# Patient Record
Sex: Female | Born: 1955 | Race: Black or African American | Hispanic: No | Marital: Married | Smoking: Never smoker
Health system: Southern US, Community
[De-identification: ages and names within clinical notes are randomized; demographics above are authoritative.]

## PROBLEM LIST (undated history)

## (undated) DIAGNOSIS — G8929 Other chronic pain: Secondary | ICD-10-CM

## (undated) DIAGNOSIS — F419 Anxiety disorder, unspecified: Secondary | ICD-10-CM

## (undated) DIAGNOSIS — M1990 Unspecified osteoarthritis, unspecified site: Secondary | ICD-10-CM

## (undated) DIAGNOSIS — N189 Chronic kidney disease, unspecified: Secondary | ICD-10-CM

## (undated) DIAGNOSIS — I10 Essential (primary) hypertension: Secondary | ICD-10-CM

## (undated) DIAGNOSIS — K219 Gastro-esophageal reflux disease without esophagitis: Secondary | ICD-10-CM

## (undated) DIAGNOSIS — E119 Type 2 diabetes mellitus without complications: Secondary | ICD-10-CM

## (undated) DIAGNOSIS — E785 Hyperlipidemia, unspecified: Secondary | ICD-10-CM

## (undated) HISTORY — PX: SPINE SURGERY: SHX786

## (undated) HISTORY — DX: Other chronic pain: G89.29

## (undated) HISTORY — DX: Essential (primary) hypertension: I10

## (undated) HISTORY — PX: TUBAL LIGATION: SHX77

## (undated) HISTORY — DX: Type 2 diabetes mellitus without complications: E11.9

## (undated) HISTORY — DX: Hyperlipidemia, unspecified: E78.5

## (undated) HISTORY — DX: Gastro-esophageal reflux disease without esophagitis: K21.9

---

## 2015-04-26 ENCOUNTER — Encounter: Payer: Self-pay | Admitting: *Deleted

## 2015-04-26 ENCOUNTER — Encounter: Payer: Medicaid Other | Attending: Endocrinology | Admitting: *Deleted

## 2015-04-26 VITALS — Ht 62.0 in | Wt 214.6 lb

## 2015-04-26 DIAGNOSIS — Z713 Dietary counseling and surveillance: Secondary | ICD-10-CM | POA: Insufficient documentation

## 2015-04-26 DIAGNOSIS — E119 Type 2 diabetes mellitus without complications: Secondary | ICD-10-CM

## 2015-04-26 NOTE — Progress Notes (Signed)
Diabetes Self-Management Education  Visit Type: First/Initial (DX 2011)  Appt. Start Time: 1400 Appt. End Time: 1530  04/26/2015  [name], identified by name and date of birth, is a 59 y.o. female with a diagnosis of Diabetes: Type 2.  Other people present during visit:  Patient, Spouse/SO . Mr & Regina Stone arrived via "Medicaid Transportation". They do not have a care or drive.They utilize the bus to go to the grocery. He does most of the cooking. Regina Stone receives one meal through [employer] 5 days per week. Regina Stone does not seem to fully comprehend basic conversation and is unable to answer questions related to types of food she eats. Her husband is very engaged and appreciative of the pictures provided. A card identifying the foods in the basic food groups. He notes that some type of professional used to come to their house to help them with their medications and food choices but they no longer come. He did not know where they came from. In review of the general eating, they are not making bad choices and their are making good decisions about portion control.   Mr. Regina Stone asked if they could come back next week to see how good she is doing. We will have them return in one months.  ASSESSMENT  Height 5\' 2"  (1.575 m), weight 214 lb 9.6 oz (97.342 kg). Body mass index is 39.24 kg/(m^2).  Initial Visit Information:  Are you currently following a meal plan?: No Are you taking your medications as prescribed?: Yes Are you checking your feet?: No How often do you need to have someone help you when you read instructions, pamphlets, or other written materials from your doctor or pharmacy?: 4 - Often   Psychosocial:   Patient Belief/Attitude about Diabetes: Motivated to manage diabetes Self-care barriers: Low literacy, Lack of transportation, Unsteady gait/risk for falls, Lack of material resources Self-management support: Doctor's office, Family, CDE visits Other persons  present: Patient, Spouse/SO Patient Concerns: Nutrition/Meal planning Special Needs: Instruct caregiver Preferred Learning Style: Visual Learning Readiness: Ready  Complications:   Last HgB A1C per patient/outside source: 5.7 mg/dL How often do you check your blood sugar?: 3-4 times/day Fasting Blood glucose range (mg/dL): 16-10970-129, [id number] (98-11996-191) Postprandial Blood glucose range (mg/dL):  (befor lunch 14-78276-117   before dinner  70-144) Have you had a dilated eye exam in the past 12 months?: No (04/28/15) Have you had a dental exam in the past 12 months?: No (05/17/15)  Diet Intake:  Breakfast: Oatmeal brown sugar cinnamon / cereal, raisin bran /  something with a heart on it. Snack (morning): grapes,  Lunch: chicken sandwich, vegetable (peas, carrotts) , white potatoes , fruit cocktail, Snack (afternoon): fruit Dinner: tuna sandwich / peach /   Snack (evening): fruit  Exercise:  Exercise: ADL's  Individualized Plan for Diabetes Self-Management Training:   Learning Objective:  Patient will have a greater understanding of diabetes self-management. Patient education plan per assessed needs and concerns is to attend individual sessions     Education Topics Reviewed with Patient Today:   Meal options for control of blood glucose level and chronic complications., Role of diet in the treatment of diabetes and the relationship between the three main macronutrients and blood glucose level Role of exercise on diabetes management, blood pressure control and cardiac health. Reviewed patients medication for diabetes, action, purpose, timing of dose and side effects. Purpose and frequency of SMBG. Dental care  PATIENTS GOALS/Plan (Developed by the patient):  Nutrition: General guidelines for  healthy choices and portions discussed Physical Activity: Exercise 1-2 times per week Medications: take my medication as prescribed   Patient Instructions  Do not drink orange juice or apple juice  or gatorade Only eat 1/2 banana at a time Eat three balanced meals per day to include protein, starch and vegetables. Two My Plate Pictures provided as examples Change from whole milk to 2% milk  Expected Outcomes:  Demonstrated interest in learning. Expect positive outcomes  Education material provided: Meal plan card and My Plate  If problems or questions, patient to contact team via:  Phone  Future DSME appointment: 4-6 wks

## 2015-04-26 NOTE — Patient Instructions (Signed)
Do not drink orange juice or apple juice or gatorade Only eat 1/2 banana at a time Eat three balanced meals per day to include protein, starch and vegetables. Two My Plate Pictures provided as examples Change from whole milk to 2% milk

## 2015-05-24 ENCOUNTER — Ambulatory Visit: Payer: Medicaid Other | Admitting: *Deleted

## 2015-05-30 ENCOUNTER — Ambulatory Visit: Payer: Self-pay | Admitting: Podiatry

## 2015-05-31 ENCOUNTER — Encounter: Payer: Self-pay | Admitting: *Deleted

## 2015-05-31 ENCOUNTER — Encounter: Payer: Medicaid Other | Attending: Endocrinology | Admitting: *Deleted

## 2015-05-31 DIAGNOSIS — Z713 Dietary counseling and surveillance: Secondary | ICD-10-CM | POA: Insufficient documentation

## 2015-05-31 DIAGNOSIS — E119 Type 2 diabetes mellitus without complications: Secondary | ICD-10-CM | POA: Insufficient documentation

## 2015-05-31 NOTE — Progress Notes (Signed)
Diabetes Self-Management Education  Visit Type:  Follow-up  Appt. Start Time: 1500 Appt. End Time: 1600  05/31/2015  [name], identified by name and date of birth, is a 59 y.o. female with a diagnosis of Diabetes: Type 2.  Patient present alone for follow up. She is usually accompanied by her husband. She states her husband is not feeling well. This is unfortunate as patient is unable to read and has poor recall ability. She is unable to tell me what she has been eating. Upon arrival she tested her sugar (2:11pm) with a result of /dl. She was given 4 oz of apple juice. Fifteen minutes later her glucose was /dl. 16X of glucose tabs were given at 2:45pm. Patient left @ 4:27 with a glucose of /dl. She does not have hypoglycemia awareness.      ASSESSMENT  Height  (1.676 m), weight 206 lb 6.4 oz (93.622 kg). Body mass index is 33.33 kg/(m^2).    Subsequent Visit Information:  Since your last visit, have you continued or began the use of a meal plan?: Yes Since your last visit, have you continued or began to exercise on a consistent basis?: Yes How many days per week are you exercising or participating in a physicial activity for more than 20 minutes?: 7 (husband walks with her) Since your last visit have you continued or begun to take your medications as prescribed?: Yes Since your last visit have you experienced any weight changes?: Loss Weight Loss (lbs): 8.6 Since your last visit, are you checking your blood glucose at least once a day?: Yes  Psychosocial:   Patient Belief/Attitude about Diabetes: Motivated to manage diabetes  Complications:   How often do you check your blood sugar?: 1-2 times/day Fasting Blood glucose range (mg/dL): 09-604, 540-981 (19-147) Postprandial Blood glucose range (mg/dL): >829, <56 (21-30) Number of hypoglycemic episodes per month: 5 Can you tell when your blood sugar is low?: No Number of hyperglycemic episodes per week:  1 Can you tell when your blood sugar is high?: No  Diet Intake:  Lunch: baked cheeseburger, / salad / water Dinner: baked fish, baked chicken, vegetables, Snack (evening): no fat yogurt Beverage(s): water, diet orange soda  Exercise:  Light Exercise amount of time (min / week): 150  Individualized Plan for Diabetes Self-Management Training:   Learning Objective:  Patient will have a greater understanding of diabetes self-management. Patient education plan per assessed needs and concerns is to attend individual sessions     Education Topics Reviewed with Patient Today:   Role of exercise on diabetes management, blood pressure control and cardiac health. Purpose and frequency of SMBG.  PATIENTS GOALS/Plan (Developed by the patient):  Nutrition: General guidelines for healthy choices and portions discussed Physical Activity: Exercise 5-7 days per week Reducing Risk: treat hypoglycemia with 15 grams of carbs if blood glucose less than /dL  Patient Self Evaluation of Goals - Patient rates self as meeting previously set goals:   Nutrition: >75% Physical Activity: >75% Medications: >75% Reducing Risk: >75%  Patient Instructions  Keep doing such a fabulous job.  You and your husband are doing a good job with food choices and making sure you don't eat too much. Your blood sugars are doing good also.  I noticed there were some glucose readings of /dl. When your sugar is below 70 I want you to have some fruit juice to drink or have some bread. Eat some food. Keep walking together daily depending on the weather. [name] has  lost 8.6# since I saw her last. CONGRATULATIONS!   Expected Outcomes:  Demonstrated interest in learning. Expect positive outcomes  If problems or questions, patient to contact team via:  Phone  Future DSME appointment: - PRN

## 2015-05-31 NOTE — Patient Instructions (Signed)
Keep doing such a fabulous job.  You and your husband are doing a good job with food choices and making sure you don't eat too much. Your blood sugars are doing good also.  I noticed there were some glucose readings of 52mg /dl. When your sugar is below 70 I want you to have some fruit juice to drink or have some bread. Eat some food. Keep walking together daily depending on the weather. Regina MossesDiana has lost 8.6# since I saw her last. CONGRATULATIONS!

## 2015-06-14 ENCOUNTER — Encounter: Payer: Self-pay | Admitting: Podiatry

## 2015-06-14 ENCOUNTER — Ambulatory Visit (INDEPENDENT_AMBULATORY_CARE_PROVIDER_SITE_OTHER): Payer: Medicaid Other | Admitting: Podiatry

## 2015-06-14 VITALS — BP 113/74 | HR 78 | Ht 62.0 in | Wt 214.0 lb

## 2015-06-14 DIAGNOSIS — M79606 Pain in leg, unspecified: Secondary | ICD-10-CM

## 2015-06-14 DIAGNOSIS — B351 Tinea unguium: Secondary | ICD-10-CM | POA: Diagnosis not present

## 2015-06-14 NOTE — Patient Instructions (Signed)
Seen for hypertrophic nails. All nails debrided. Return as needed.  

## 2015-06-14 NOTE — Progress Notes (Signed)
Subjective: 59 year old female presents complaining of pain on both feet. Nails are grown in and been hurting for several days.  Patient does not understand why she is on Lyrica.   Review of Systems - Negative except chief complaints.   Objective:  Dermatologic: Dystrophic and hypertrophic nails x 10. Ingrown nails 2nd bilateral. Distal ends are over grown and digging into flesh.  Vascular: All pedal pulses are palpable. No edema or erythema noted. Neurologic: All epicritic and tactile sensations are grossly intact. Orthopedic: No gross deformities noted.  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  Plan: All nails debrided. Pain relieved.

## 2015-06-29 ENCOUNTER — Ambulatory Visit: Payer: Medicaid Other | Admitting: *Deleted

## 2015-07-13 ENCOUNTER — Encounter: Payer: Self-pay | Admitting: *Deleted

## 2015-07-13 ENCOUNTER — Encounter: Payer: Medicaid Other | Attending: Endocrinology | Admitting: *Deleted

## 2015-07-13 VITALS — Wt 211.1 lb

## 2015-07-13 DIAGNOSIS — Z713 Dietary counseling and surveillance: Secondary | ICD-10-CM | POA: Diagnosis not present

## 2015-07-13 DIAGNOSIS — E119 Type 2 diabetes mellitus without complications: Secondary | ICD-10-CM | POA: Diagnosis not present

## 2015-07-13 NOTE — Patient Instructions (Signed)
Continue walking as many days as possible Try to add some vegetables to your meals Decrease you glucose testing to 2 times per day to include Fasting (before breakfast) and 2 hours after dinner Add some peanut butter to your 1/2 banana night time snack to see if we can get your fasting blood sugar to come down.  You are doing a great job! Keep it up.

## 2015-08-17 ENCOUNTER — Encounter: Payer: Self-pay | Admitting: *Deleted

## 2015-08-17 ENCOUNTER — Encounter: Payer: Medicaid Other | Attending: Endocrinology | Admitting: *Deleted

## 2015-08-17 VITALS — Wt 207.9 lb

## 2015-08-17 DIAGNOSIS — Z713 Dietary counseling and surveillance: Secondary | ICD-10-CM | POA: Insufficient documentation

## 2015-08-17 DIAGNOSIS — E119 Type 2 diabetes mellitus without complications: Secondary | ICD-10-CM | POA: Insufficient documentation

## 2015-08-17 NOTE — Patient Instructions (Addendum)
At night time when you have your snack - piece of fruit I would like you to add some protein....Marland Kitchennuts, cheese, peanut butter, boiled egg  Test two times each day. First thing in the morning before breakfast and 2 hours after dinner.  You are doing a great job. Keep up the good work.  It has been a pleasure working with you. Come see me if you have any further problems.

## 2015-08-17 NOTE — Progress Notes (Signed)
Diabetes Self-Management Education  Visit Type:  Follow-up  Appt. Start Time: 1400 Appt. End Time: 1430  08/17/2015  [name], identified by name and date of birth, is a 59 y.o. female with a diagnosis of Diabetes: Type 2.  Gerianne has recently changed PCP. However she does not recall their name. She was also put on a new medication that she thinks is the help her blood sugar. Her prescriptions are sent to mail in pharmacy and does not know the name of the medication. Regina Stone's dietary intake has greatly improved as well as her glucose numbers since I first saw her.  ASSESSMENT  Weight 207 lb 14.4 oz (94.303 kg). Body mass index is 38.02 kg/(m^2).       Diabetes Self-Management Education - 08/17/15 1408    Health Coping   How would you rate your overall health? Good   Psychosocial Assessment   Patient Belief/Attitude about Diabetes Motivated to manage diabetes   Self-care barriers Low literacy   Self-management support Doctor's office;Family;CDE visits   Patient Concerns Nutrition/Meal planning;Glycemic Control   Special Needs Simplified materials   Preferred Learning Style No preference indicated   Learning Readiness Change in progress   Complications   How often do you check your blood sugar? 3-4 times/day   Fasting Blood glucose range (mg/dL) 16-109;604-540  98-119   Postprandial Blood glucose range (mg/dL) 14-782;>956  21-308   Number of hypoglycemic episodes per month 0   Number of hyperglycemic episodes per week 0.5   Dietary Intake   Breakfast oatmeal, toast, diet jelly, water   Snack (morning) none   Lunch baked hamburger, on wheat bread, cheese, diet coke,    Snack (afternoon) none   Regina Stone on bun X1, potato, carrots, water   Snack (evening) fruit   Beverage(s) water, diet coke, Diet V8 juice   Exercise   Exercise Type ADL's   Patient Education   Previous Diabetes Education Yes (please comment)   Nutrition management  Meal options for control  of blood glucose level and chronic complications.   Monitoring Taught/discussed recording of test results and interpretation of SMBG.   Individualized Goals (developed by patient)   Nutrition General guidelines for healthy choices and portions discussed   Medications take my medication as prescribed   Monitoring  test blood glucose pre and post meals as discussed   Patient Self-Evaluation of Goals - Patient rates self as meeting previously set goals (% of time)   Nutrition >75%   Physical Activity 25 - 50%   Medications >75%   Monitoring >75%   Reducing Risk >75%   Health Coping >75%   Outcomes   Program Status Completed   Subsequent Visit   Since your last visit, have you continued or began the use of a meal plan? Yes   How many days a week are you following a meal plan? 7   Since your last visit, have you continued or began to exercise on a consistent basis? Yes   Since your last visit have you continued or begun to take your medications as prescribed? Yes   Since your last visit have you had your blood pressure checked? Yes   Since your last visit have you experienced any weight changes? Loss   Since your last visit, are you checking your blood glucose at least once a day? Yes      Learning Objective:  Patient will have a greater understanding of diabetes self-management. Patient education plan is to attend individual  and/or group sessions per assessed needs and concerns.   Plan:   Patient Instructions  At night time when you have your snack - piece of fruit I would like you to add some protein....Regina Stone, cheese, peanut butter, boiled egg  Test two times each day. First thing in the morning before breakfast and 2 hours after dinner.  You are doing a great job. Keep up the good work.  It has been a pleasure working with you. Come see me if you have any further problems.     Expected Outcomes:  Demonstrated interest in learning. Expect positive outcomes  If problems or  questions, patient to contact team via:  Phone  Future DSME appointment: - PRN

## 2016-01-19 ENCOUNTER — Encounter: Payer: Medicaid Other | Attending: Internal Medicine | Admitting: *Deleted

## 2016-01-19 ENCOUNTER — Encounter: Payer: Self-pay | Admitting: *Deleted

## 2016-01-19 DIAGNOSIS — E1142 Type 2 diabetes mellitus with diabetic polyneuropathy: Secondary | ICD-10-CM | POA: Insufficient documentation

## 2016-01-19 DIAGNOSIS — E119 Type 2 diabetes mellitus without complications: Secondary | ICD-10-CM

## 2016-01-19 NOTE — Progress Notes (Signed)
Diabetes Self-Management Education  Visit Type:  Follow-up  Appt. Start Time: 0800 Appt. End Time: 0830  01/19/2016  Regina Stone, identified by name and date of birth, is a 60 y.o. female with a diagnosis of Diabetes:  Patient present for follow up of T2DM. In review of her glucose readings; FBS range 115-212mg /dl I scheduled an appointment with Regina Peri, FNP-BC at [facility] for Monday 01/23/2016 @ 9:10AM. Patient also noted that she told her pharmacist that her HTN medication was making her sick so it was discontinued. No further evaluation of HTN has been performed, no replacement medication provided. I hae encouraged Regina Stone to continue f/u with Regina Stone regularly for management of chronic health conditions. Regina Stone is very compliant, verbalized understanding and is appreciative of assistance.  ASSESSMENT       Diabetes Self-Management Education - 01/19/16 0826    Psychosocial Assessment   Patient Belief/Attitude about Diabetes Motivated to manage diabetes   Self-care barriers Low literacy   Self-management support Doctor's office;Family   Patient Concerns Medication;Glycemic Control   Special Needs Simplified materials   Preferred Learning Style No preference indicated   Learning Readiness Change in progress   Complications   How often do you check your blood sugar? 1-2 times/day   Fasting Blood glucose range (mg/dL) 19-147;>829   Exercise   Exercise Type ADL's   Patient Education   Previous Diabetes Education Yes (please comment)   Medications Other (comment)  HTN medication discontinued to go "Make me sick"  no f/u with PCP for new medication   Individualized Goals (developed by patient)   Medications Other (comment)  Schedule appointmetn with PCP for evaluation of HTN and T2DM medications   Patient Self-Evaluation of Goals - Patient rates self as meeting previously set goals (% of time)   Nutrition >75%   Physical Activity 25 - 50%   Medications >75%   Monitoring >75%   Problem Solving < 25%   Reducing Risk >75%   Health Coping 50 - 75 %   Outcomes   Program Status Completed   Subsequent Visit   Since your last visit have you continued or begun to take your medications as prescribed? Yes  Pt c/o HTN medication making her sick. Pharmacy advised her to discontinue. Has not followed up with PCP    Since your last visit have you had your blood pressure checked? No   Since your last visit have you experienced any weight changes? No change   Since your last visit, are you checking your blood glucose at least once a day? Yes      Learning Objective:  Patient will have a greater understanding of diabetes self-management. Patient education plan is to attend individual and/or group sessions per assessed needs and concerns.   Patient Instructions  Continue eating well Continue testing your glucose FBS and 2hpp Follow up with Regina Regina Saa FNP-BC at [facility] on 01/23/16 @ 9:10AM  Expected Outcomes:  Demonstrated interest in learning. Expect positive outcomes  If problems or questions, patient to contact team via:  Phone  Future DSME appointment: - PRN

## 2016-01-19 NOTE — Patient Instructions (Signed)
Continue eating well Continue testing your glucose FBS and 2hpp Follow up with laura Dreama Saa FNP-BC at [facility] on 01/23/16 @ 9:10AM

## 2016-07-11 ENCOUNTER — Ambulatory Visit (INDEPENDENT_AMBULATORY_CARE_PROVIDER_SITE_OTHER): Payer: Medicaid Other | Admitting: Podiatry

## 2016-07-11 ENCOUNTER — Encounter: Payer: Self-pay | Admitting: Podiatry

## 2016-07-11 VITALS — BP 133/80 | HR 75

## 2016-07-11 DIAGNOSIS — B351 Tinea unguium: Secondary | ICD-10-CM

## 2016-07-11 DIAGNOSIS — M79606 Pain in leg, unspecified: Secondary | ICD-10-CM | POA: Diagnosis not present

## 2016-07-11 NOTE — Patient Instructions (Signed)
Seen for hypertrophic nails. All nails debrided. Return in 3 months or as needed.  

## 2016-07-11 NOTE — Progress Notes (Signed)
Subjective: 60 year old female presents complaining of pain on 2nd toe left foot.  Nails are grown in and been hurting for several days.  Patient does not understand why she is on Lyrica.   Review of Systems - Negative except chief complaints.   Objective:  Dermatologic: Dystrophic and hypertrophic nails x 10. Thick and long left great toe nail was pressing against the toe 2nd left and causing hard callused skin with pain. Vascular: All pedal pulses are palpable. No edema or erythema noted. Neurologic: All epicritic and tactile sensations are grossly intact. Orthopedic: No gross deformities noted.  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 Pain in 2nd toe left from adjacent thick long nail digging into skin.  Plan: All nails debrided. Pain relieved.

## 2016-10-10 ENCOUNTER — Encounter: Payer: Self-pay | Admitting: Podiatry

## 2016-10-10 ENCOUNTER — Ambulatory Visit (INDEPENDENT_AMBULATORY_CARE_PROVIDER_SITE_OTHER): Payer: Medicaid Other | Admitting: Podiatry

## 2016-10-10 VITALS — BP 135/85 | HR 74

## 2016-10-10 DIAGNOSIS — M79673 Pain in unspecified foot: Secondary | ICD-10-CM | POA: Diagnosis not present

## 2016-10-10 DIAGNOSIS — B351 Tinea unguium: Secondary | ICD-10-CM

## 2016-10-10 DIAGNOSIS — M79606 Pain in leg, unspecified: Secondary | ICD-10-CM

## 2016-10-10 NOTE — Progress Notes (Signed)
Subjective: 60 year old female presents complaining of painful feet. Nails are grown in and been hurting for several days.  Patient does not understand why s. Patient points 2nd toe left foot has and painful knot.    Objective: Dermatologic: Dystrophic and hypertrophic nails x 10. Enlarged distal medial phalangeal bone with pain from pressing against the great toe. Thick broad plantar calluses on both feet.  Vascular: All pedal pulses are palpable. No edema or erythema noted. Neurologic: All epicritic and tactile sensations are grossly intact. Orthopedic: No gross deformities noted.  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 Phalangeal spur with pain in 2nd toe left with pain in shoes.  Plan: All nails, corns, and calluses debrided.

## 2016-10-10 NOTE — Patient Instructions (Signed)
Seen for hypertrophic nails, corns and calluses on both feet. All nails, corns, and calluses debrided. Return in 3 months or as needed.

## 2016-11-14 ENCOUNTER — Telehealth (INDEPENDENT_AMBULATORY_CARE_PROVIDER_SITE_OTHER): Payer: Self-pay | Admitting: Orthopaedic Surgery

## 2016-11-14 NOTE — Telephone Encounter (Signed)
I called patient. She requests refill on Ibuprofen 800mg . Please advise.

## 2016-11-14 NOTE — Telephone Encounter (Signed)
OK refill thanks 

## 2016-11-14 NOTE — Telephone Encounter (Signed)
Pt requesting a refill. Could not understand what she was saying as far as the mediction but she said dr. Ophelia Charteryates prescibed it. [phone]

## 2016-11-15 MED ORDER — IBUPROFEN 800 MG PO TABS: 800.0000 mg | ORAL_TABLET | Freq: Two times a day (BID) | ORAL | 0 refills | Status: DC

## 2016-11-15 NOTE — Telephone Encounter (Signed)
I called patient and advised. Script entered and sent to pharmacy.

## 2016-12-10 ENCOUNTER — Other Ambulatory Visit (INDEPENDENT_AMBULATORY_CARE_PROVIDER_SITE_OTHER): Payer: Self-pay | Admitting: Orthopaedic Surgery

## 2016-12-11 NOTE — Telephone Encounter (Signed)
Ok for refill? 

## 2017-01-10 ENCOUNTER — Encounter: Payer: Self-pay | Admitting: Podiatry

## 2017-01-10 ENCOUNTER — Ambulatory Visit (INDEPENDENT_AMBULATORY_CARE_PROVIDER_SITE_OTHER): Payer: Medicaid Other | Admitting: Podiatry

## 2017-01-10 DIAGNOSIS — M216X2 Other acquired deformities of left foot: Secondary | ICD-10-CM | POA: Diagnosis not present

## 2017-01-10 DIAGNOSIS — M79672 Pain in left foot: Secondary | ICD-10-CM | POA: Diagnosis not present

## 2017-01-10 DIAGNOSIS — M79606 Pain in leg, unspecified: Secondary | ICD-10-CM

## 2017-01-10 DIAGNOSIS — M659 Synovitis and tenosynovitis, unspecified: Secondary | ICD-10-CM | POA: Diagnosis not present

## 2017-01-10 NOTE — Patient Instructions (Signed)
Seen for painful left ankle and hypertrophic nails. All nails debrided. May use Advil for ankle pain. Return in 3 months or as needed.

## 2017-01-10 NOTE — Progress Notes (Signed)
Subjective: [age]year old female presents complaining of painful feet. Left ankle has been hurting for long time. Hurts even at night. Patient goes to store on foot.  Objective: Dermatologic: Dystrophic and hypertrophic nails x 10. Vascular: All pedal pulses are palpable. No edema or erythema noted. Mild left lateral ankle edema. Neurologic: All epicritic and tactile sensations are grossly intact. Orthopedic: Positive of excess inversion of ankle joint. Excess ankle joint pronation bilateral L>R. No pain with range of motion.   Assessment: Onychogryphosis bilateral. Tenosynovitis left ankle. Hyperpronation ankle joint L>R.  Plan: All nails debrided.  Advised to take Advil as needed for ankle pain.  Return in 3 months or sooner if needed.

## 2017-02-01 ENCOUNTER — Other Ambulatory Visit (INDEPENDENT_AMBULATORY_CARE_PROVIDER_SITE_OTHER): Payer: Self-pay | Admitting: Orthopaedic Surgery

## 2017-02-04 NOTE — Telephone Encounter (Signed)
Ok for refill? 

## 2017-02-28 ENCOUNTER — Other Ambulatory Visit (INDEPENDENT_AMBULATORY_CARE_PROVIDER_SITE_OTHER): Payer: Self-pay | Admitting: Orthopaedic Surgery

## 2017-03-01 NOTE — Telephone Encounter (Signed)
Rx request 

## 2017-04-09 ENCOUNTER — Ambulatory Visit (INDEPENDENT_AMBULATORY_CARE_PROVIDER_SITE_OTHER): Payer: Medicaid Other | Admitting: Podiatry

## 2017-04-09 ENCOUNTER — Encounter: Payer: Self-pay | Admitting: Podiatry

## 2017-04-09 DIAGNOSIS — M216X2 Other acquired deformities of left foot: Secondary | ICD-10-CM | POA: Diagnosis not present

## 2017-04-09 DIAGNOSIS — B351 Tinea unguium: Secondary | ICD-10-CM | POA: Diagnosis not present

## 2017-04-09 DIAGNOSIS — Q828 Other specified congenital malformations of skin: Secondary | ICD-10-CM

## 2017-04-09 DIAGNOSIS — M79606 Pain in leg, unspecified: Secondary | ICD-10-CM

## 2017-04-09 NOTE — Progress Notes (Signed)
Subjective: [age]year old female presents complaining of pain in right foot.   Objective: Dermatologic: Dystrophic and hypertrophic nails x 10. Thick plantar callus under the base of the 5th metatarsal right, symptomatic.  Vascular: All pedal pulses are palpable. No edema or erythema noted. Neurologic: All epicritic and tactile sensations are grossly intact. Orthopedic: Excess ankle joint pronation bilateral L>R.  Assessment: Onychomycosis bilateral. Symptomatic plantar keratosis right. Hyperpronation ankle joint with lateral weight shifting right.  Plan: All nails and painful calluses debrided.  Return in 3 months or sooner if needed.

## 2017-04-09 NOTE — Patient Instructions (Signed)
Seen for painful callus on right foot and hypertrophic nails. All nails and calluses debrided. Return in 3 months or as needed.

## 2017-05-17 ENCOUNTER — Other Ambulatory Visit (INDEPENDENT_AMBULATORY_CARE_PROVIDER_SITE_OTHER): Payer: Self-pay | Admitting: Orthopaedic Surgery

## 2017-05-20 NOTE — Telephone Encounter (Signed)
Rx request 

## 2017-05-27 ENCOUNTER — Ambulatory Visit: Payer: Medicaid Other | Admitting: Registered"

## 2017-06-10 ENCOUNTER — Encounter: Payer: Self-pay | Admitting: Registered"

## 2017-06-10 ENCOUNTER — Encounter: Payer: Medicaid Other | Attending: Internal Medicine | Admitting: Registered"

## 2017-06-10 DIAGNOSIS — Z713 Dietary counseling and surveillance: Secondary | ICD-10-CM | POA: Insufficient documentation

## 2017-06-10 DIAGNOSIS — E119 Type 2 diabetes mellitus without complications: Secondary | ICD-10-CM

## 2017-06-10 NOTE — Patient Instructions (Signed)
Keep up good work getting in some walking to the store. Think about having more vegetables like tring beans, celery, carrots, leafy greens. Keep taking your medications as prescribed.

## 2017-06-10 NOTE — Progress Notes (Signed)
Diabetes Self-Management Education  Visit Type: First/Initial  Appt. Start Time: 1405 Appt. End Time: 1435  06/10/2017  [name], identified by name and date of birth, is a 61 y.o. female with a diagnosis of Diabetes: Type 2.   ASSESSMENT Pt states she always uses can to aid walking. Pt states she and her [place] [employer] (RD mapped distance: 1/2 mile). Pt states she doesn't do any other walking besides that.  Based on diet recall pt may not be getting much vegetables in diet, but appears to enjoy a variety of them. RD encouraged to eat more vegetables.     Diabetes Self-Management Education - 06/10/17 1419      Visit Information   Visit Type First/Initial     Initial Visit   Diabetes Type Type 2   Are you taking your medications as prescribed? Yes     Psychosocial Assessment   Patient Belief/Attitude about Diabetes Motivated to manage diabetes   Self-care barriers Low literacy   How often do you need to have someone help you when you read instructions, pamphlets, or other written materials from your doctor or pharmacy? 5 - Always     Complications   Last HgB A1C per patient/outside source 6.3 %  per chart 12/04/16   How often do you check your blood sugar? 1-2 times/day   Fasting Blood glucose range (mg/dL) 84-696;295-28470-129;130-179  pt shared BG log, rarely goes above 130   Postprandial Blood glucose range (mg/dL) 132-440130-179   Number of hypoglycemic episodes per month 1   Can you tell when your blood sugar is low? --  pt didn't understand question     Dietary Intake   Breakfast eggs, milk, bacon (no salt)   Snack (morning) apple   Lunch wwheat bread, tomato & mayo   Snack (afternoon) none   Dinner hamburger, lettuce, mayo   Snack (evening) none   Beverage(s) water, diet soda     Exercise   Exercise Type Light (walking / raking leaves)  walks to [employer], 1 mi round trip   How many days per week to you exercise? --  pt not able to quantify      Patient Education   Previous Diabetes Education Yes (please comment)   Nutrition management  Role of diet in the treatment of diabetes and the relationship between the three main macronutrients and blood glucose level   Physical activity and exercise  Role of exercise on diabetes management, blood pressure control and cardiac health.     Individualized Goals (developed by patient)   Nutrition General guidelines for healthy choices and portions discussed   Physical Activity Exercise 5-7 days per week     Outcomes   Expected Outcomes Demonstrated interest in learning. Expect positive outcomes   Future DMSE PRN   Program Status Completed    Individualized Plan for Diabetes Self-Management Training:   Learning Objective:  Patient will have a greater understanding of diabetes self-management. Patient education plan is to attend individual and/or group sessions per assessed needs and concerns.   Patient Instructions  Keep up good work getting in some walking to the store. Think about having more vegetables like string beans, celery, carrots, leafy greens. Keep taking your medications as prescribed.   Expected Outcomes:  Demonstrated interest in learning. Expect positive outcomes  Education material provided: My Plate  If problems or questions, patient to contact team via:  Phone  Future DSME appointment: PRN

## 2017-06-14 ENCOUNTER — Other Ambulatory Visit (INDEPENDENT_AMBULATORY_CARE_PROVIDER_SITE_OTHER): Payer: Self-pay | Admitting: Orthopaedic Surgery

## 2017-06-17 NOTE — Telephone Encounter (Signed)
Ok for refill? 

## 2017-07-10 ENCOUNTER — Ambulatory Visit: Payer: Medicaid Other | Admitting: Podiatry

## 2017-07-16 ENCOUNTER — Ambulatory Visit: Payer: Medicaid Other | Admitting: Podiatry

## 2017-07-23 ENCOUNTER — Ambulatory Visit (INDEPENDENT_AMBULATORY_CARE_PROVIDER_SITE_OTHER): Payer: Medicaid Other | Admitting: Podiatry

## 2017-07-23 ENCOUNTER — Encounter: Payer: Self-pay | Admitting: Podiatry

## 2017-07-23 DIAGNOSIS — B351 Tinea unguium: Secondary | ICD-10-CM | POA: Diagnosis not present

## 2017-07-23 DIAGNOSIS — M79606 Pain in leg, unspecified: Secondary | ICD-10-CM | POA: Diagnosis not present

## 2017-07-23 DIAGNOSIS — Q828 Other specified congenital malformations of skin: Secondary | ICD-10-CM

## 2017-07-23 NOTE — Progress Notes (Signed)
Subjective: [age]year old female presents complaining of painful nails on both feet. This is the same pain she had on her last visit.  Objective: Dermatologic: Dystrophic and hypertrophic nails x 10. Thick plantar callus under the base of the 5th metatarsal right, symptomatic.  Vascular: All pedal pulses are palpable. No edema or erythema noted. Neurologic: All epicritic and tactile sensations are grossly intact. Orthopedic: Excess ankle joint pronation bilateral L>R.  Assessment: Onychomycosis bilateral. Symptomatic plantar keratosis right. Hyperpronation ankle joint with lateral weight shifting right.  Plan: All nails and painful calluses debrided.  Return in 3 months or sooner if needed.

## 2017-07-23 NOTE — Patient Instructions (Signed)
Seen for hypertrophic nails. All nails debrided. Return in 3 months or as needed.  

## 2017-10-22 ENCOUNTER — Ambulatory Visit (INDEPENDENT_AMBULATORY_CARE_PROVIDER_SITE_OTHER): Payer: Medicaid Other | Admitting: Podiatry

## 2017-10-22 ENCOUNTER — Encounter: Payer: Self-pay | Admitting: Podiatry

## 2017-10-22 DIAGNOSIS — B351 Tinea unguium: Secondary | ICD-10-CM

## 2017-10-22 DIAGNOSIS — M79672 Pain in left foot: Secondary | ICD-10-CM | POA: Diagnosis not present

## 2017-10-22 DIAGNOSIS — L600 Ingrowing nail: Secondary | ICD-10-CM | POA: Diagnosis not present

## 2017-10-22 NOTE — Progress Notes (Signed)
Subjective: 61 y.o. year old female patient presents complaining of painful toe 2nd left. Patient points distal medial enlarged bump.  Objective: Dermatologic: Thick yellow deformed nails x 10. Valgus rotated left great toe with thick ingrown dystrophic nail is pressing against the 2nd toe left resulting pain.  Plantar callus under the 5th MPJ right painful. Vascular: Pedal pulses are all palpable. No edema or erythema noted. Orthopedic: Severe hallux valgus with bunion L>R, severe ankle joint pronation L>R. Enlarged phalangeal head distal medial 2nd toe left with pain in closed in shoes. Neurologic: All epicritic and tactile sensations grossly intact.  Assessment: Dystrophic mycotic nails x 10. Ingrown left great toe without infection. Pain in 2nd toe left due to pressure from valgus rotated thick dystrophic hallucal nail.  Treatment: All mycotic nails, corns, calluses debrided. Pain relieved. Return in 3 months or as needed.

## 2017-10-22 NOTE — Patient Instructions (Signed)
Seen for hypertrophic nails. All nails debrided. Return in 3 months or as needed.  

## 2018-01-20 ENCOUNTER — Ambulatory Visit: Payer: Self-pay | Admitting: Podiatry

## 2018-02-14 ENCOUNTER — Ambulatory Visit: Payer: Medicaid Other | Admitting: Podiatry

## 2018-02-14 ENCOUNTER — Encounter: Payer: Self-pay | Admitting: Podiatry

## 2018-02-14 DIAGNOSIS — M79675 Pain in left toe(s): Secondary | ICD-10-CM

## 2018-02-14 DIAGNOSIS — M79674 Pain in right toe(s): Secondary | ICD-10-CM

## 2018-02-14 DIAGNOSIS — B351 Tinea unguium: Secondary | ICD-10-CM | POA: Diagnosis not present

## 2018-02-14 DIAGNOSIS — M79676 Pain in unspecified toe(s): Secondary | ICD-10-CM | POA: Diagnosis not present

## 2018-02-14 DIAGNOSIS — L989 Disorder of the skin and subcutaneous tissue, unspecified: Secondary | ICD-10-CM

## 2018-02-16 NOTE — Progress Notes (Signed)
Subjective:   Patient ID: Regina Stone, female   DOB: 62 y.o.   MRN: [id number]   HPI 62 year old female presents the office today with concerns of thick, painful, elongated toenails that she cannot trim herself as well as for painful lesion on her left second toe.  She denies any recent injury or trauma.  She previously was seen Dr. Raynald Kemp for the same issues and she has no other concerns today.  Denies any open sores and denies any swelling or redness.   Review of Systems  All other systems reviewed and are negative.  Past Medical History:  Diagnosis Date  . Chronic pain    previous back surgery, foot pain  . Diabetes mellitus without complication (HCC)   . GERD (gastroesophageal reflux disease)   . Hyperlipidemia   . Hypertension     Past Surgical History:  Procedure Laterality Date  . SPINE SURGERY    . TUBAL LIGATION       Current Outpatient Medications:  .  aspirin 81 MG tablet, Take 81 mg by mouth daily., Disp: , Rfl:  .  ibuprofen (ADVIL,MOTRIN) 800 MG tablet, TAKE 1 TABLET BY MOUTH TWICE DAILY AS NEEDED., Disp: 60 tablet, Rfl: 2 .  lisinopril-hydrochlorothiazide (PRINZIDE,ZESTORETIC) 20-25 MG per tablet, Take 1 tablet by mouth daily. Reported on 01/19/2016, Disp: , Rfl:  .  metFORMIN (GLUCOPHAGE) 1000 MG tablet, Take 1,000 mg by mouth 2 (two) times daily with a meal., Disp: , Rfl:  .  omeprazole (PRILOSEC) 40 MG capsule, Take 40 mg by mouth daily., Disp: , Rfl:  .  ondansetron (ZOFRAN) 4 MG tablet, Take 4 mg by mouth every 8 (eight) hours as needed for nausea or vomiting., Disp: , Rfl:  .  pregabalin (LYRICA) 50 MG capsule, Take 50 mg by mouth 2 (two) times daily., Disp: , Rfl:  .  simvastatin (ZOCOR) 80 MG tablet, Take 80 mg by mouth at bedtime., Disp: , Rfl:  .  sitaGLIPtin (JANUVIA) 100 MG tablet, Take 100 mg by mouth daily., Disp: , Rfl:  .  traMADol (ULTRAM) 50 MG tablet, Take 50 mg by mouth every 6 (six) hours as needed., Disp: , Rfl:   Allergies  Allergen  Reactions  . Avandamet [Rosiglitazone-Metformin]   . Avapro [Irbesartan]     Social History   Socioeconomic History  . Marital status: Married    Spouse name: Not on file  . Number of children: Not on file  . Years of education: Not on file  . Highest education level: Not on file  Occupational History  . Not on file  Social Needs  . Financial resource strain: Not on file  . Food insecurity:    Worry: Not on file    Inability: Not on file  . Transportation needs:    Medical: Not on file    Non-medical: Not on file  Tobacco Use  . Smoking status: Never Smoker  . Smokeless tobacco: Never Used  Substance and Sexual Activity  . Alcohol use: Not on file  . Drug use: Not on file  . Sexual activity: Not on file  Lifestyle  . Physical activity:    Days per week: Not on file    Minutes per session: Not on file  . Stress: Not on file  Relationships  . Social connections:    Talks on phone: Not on file    Gets together: Not on file    Attends religious service: Not on file    Active member of club or  organization: Not on file    Attends meetings of clubs or organizations: Not on file    Relationship status: Not on file  . Intimate partner violence:    Fear of current or ex partner: Not on file    Emotionally abused: Not on file    Physically abused: Not on file    Forced sexual activity: Not on file  Other Topics Concern  . Not on file  Social History Narrative  . Not on file         Objective:  Physical Exam  General: AAO x3, NAD  Dermatological: Nails are hypertrophic, dystrophic, brittle, discolored, elongated 10. No surrounding redness or drainage. Tenderness nails 1-5 bilaterally.  Hyperkeratotic lesion left second toe of the DIPJ.  No underlying ulceration drainage or any signs of infection.  No open lesions or pre-ulcerative lesions are identified today.  Vascular: Dorsalis Pedis artery and Posterior Tibial artery pedal pulses are 2/4 bilateral with immedate  capillary fill time. Pedal hair growth present. No varicosities and no lower extremity edema present bilateral. There is no pain with calf compression, swelling, warmth, erythema.   Neruologic: Grossly intact via light touch bilateral. Vibratory intact via tuning fork bilateral. Protective threshold with Semmes Wienstein monofilament intact to all pedal sites bilateral. Patellar and Achilles deep tendon reflexes 2+ bilateral. No Babinski or clonus noted bilateral.   Musculoskeletal: HAV present. . Muscular strength 5/5 in all groups tested bilateral.  Gait: Unassisted, Nonantalgic.       Assessment:  62 year old female with symptomatic onychomycosis, hyperkeratotic lesion      Plan:   -Treatment options discussed including all alternatives, risks, and complications -Etiology of symptoms were discussed -Nails debrided 10 without complications or bleeding. -Hyperkeratotic lesions sharply debrided x 1 without any complications or bleeding  -Daily foot inspection -Follow-up in 3 months or sooner if any problems arise. In the meantime, encouraged to call the office with any questions, concerns, change in symptoms.   Ovid CurdMatthew Wagoner, DPM

## 2018-05-26 ENCOUNTER — Ambulatory Visit: Payer: Medicaid Other | Admitting: Podiatry

## 2018-08-12 ENCOUNTER — Other Ambulatory Visit: Payer: Self-pay

## 2018-08-12 ENCOUNTER — Inpatient Hospital Stay (HOSPITAL_COMMUNITY)
Admission: EM | Admit: 2018-08-12 | Discharge: 2018-08-19 | DRG: 296 | Disposition: E | Payer: Medicaid Other | Attending: Emergency Medicine | Admitting: Emergency Medicine

## 2018-08-12 ENCOUNTER — Emergency Department (HOSPITAL_COMMUNITY): Payer: Medicaid Other

## 2018-08-12 ENCOUNTER — Encounter (HOSPITAL_COMMUNITY): Payer: Self-pay | Admitting: Pulmonary Disease

## 2018-08-12 DIAGNOSIS — F419 Anxiety disorder, unspecified: Secondary | ICD-10-CM | POA: Diagnosis present

## 2018-08-12 DIAGNOSIS — Z8249 Family history of ischemic heart disease and other diseases of the circulatory system: Secondary | ICD-10-CM | POA: Diagnosis not present

## 2018-08-12 DIAGNOSIS — J9601 Acute respiratory failure with hypoxia: Secondary | ICD-10-CM | POA: Diagnosis present

## 2018-08-12 DIAGNOSIS — E785 Hyperlipidemia, unspecified: Secondary | ICD-10-CM | POA: Diagnosis present

## 2018-08-12 DIAGNOSIS — R579 Shock, unspecified: Secondary | ICD-10-CM | POA: Diagnosis present

## 2018-08-12 DIAGNOSIS — E1122 Type 2 diabetes mellitus with diabetic chronic kidney disease: Secondary | ICD-10-CM | POA: Diagnosis present

## 2018-08-12 DIAGNOSIS — R402112 Coma scale, eyes open, never, at arrival to emergency department: Secondary | ICD-10-CM | POA: Diagnosis present

## 2018-08-12 DIAGNOSIS — Z7982 Long term (current) use of aspirin: Secondary | ICD-10-CM | POA: Diagnosis not present

## 2018-08-12 DIAGNOSIS — Z79899 Other long term (current) drug therapy: Secondary | ICD-10-CM

## 2018-08-12 DIAGNOSIS — I469 Cardiac arrest, cause unspecified: Secondary | ICD-10-CM | POA: Diagnosis present

## 2018-08-12 DIAGNOSIS — Z888 Allergy status to other drugs, medicaments and biological substances status: Secondary | ICD-10-CM

## 2018-08-12 DIAGNOSIS — K219 Gastro-esophageal reflux disease without esophagitis: Secondary | ICD-10-CM | POA: Diagnosis present

## 2018-08-12 DIAGNOSIS — I120 Hypertensive chronic kidney disease with stage 5 chronic kidney disease or end stage renal disease: Secondary | ICD-10-CM | POA: Diagnosis present

## 2018-08-12 DIAGNOSIS — Z66 Do not resuscitate: Secondary | ICD-10-CM | POA: Diagnosis present

## 2018-08-12 DIAGNOSIS — R402212 Coma scale, best verbal response, none, at arrival to emergency department: Secondary | ICD-10-CM | POA: Diagnosis present

## 2018-08-12 DIAGNOSIS — R402312 Coma scale, best motor response, none, at arrival to emergency department: Secondary | ICD-10-CM | POA: Diagnosis present

## 2018-08-12 DIAGNOSIS — M545 Low back pain: Secondary | ICD-10-CM | POA: Diagnosis present

## 2018-08-12 DIAGNOSIS — G8929 Other chronic pain: Secondary | ICD-10-CM | POA: Diagnosis present

## 2018-08-12 DIAGNOSIS — N186 End stage renal disease: Secondary | ICD-10-CM | POA: Diagnosis present

## 2018-08-12 DIAGNOSIS — Z7984 Long term (current) use of oral hypoglycemic drugs: Secondary | ICD-10-CM

## 2018-08-12 HISTORY — DX: Unspecified osteoarthritis, unspecified site: M19.90

## 2018-08-12 HISTORY — DX: Chronic kidney disease, unspecified: N18.9

## 2018-08-12 HISTORY — DX: Anxiety disorder, unspecified: F41.9

## 2018-08-12 LAB — I-STAT BETA HCG BLOOD, ED (MC, WL, AP ONLY): I-stat hCG, quantitative: <5 m[IU]/mL (ref <5)

## 2018-08-12 LAB — CBC
HEMATOCRIT: 48.7 % — AB (ref 36.0–46.0)
Hemoglobin: 14.3 g/dL (ref 12.0–15.0)
MCH: 28.4 pg (ref 26.0–34.0)
MCHC: 29.4 g/dL — AB (ref 30.0–36.0)
MCV: 96.6 fL (ref 78.0–100.0)
Platelets: 52 10*3/uL — ABNORMAL LOW (ref 150–400)
RBC: 5.04 MIL/uL (ref 3.87–5.11)
RDW: 12.9 % (ref 11.5–15.5)
WBC: 18.3 10*3/uL — ABNORMAL HIGH (ref 4.0–10.5)

## 2018-08-12 LAB — BASIC METABOLIC PANEL
Anion gap: 13 (ref 5–15)
BUN: 23 mg/dL — AB (ref 6–20)
CHLORIDE: 111 mmol/L (ref 98–111)
CO2: 27 mmol/L (ref 22–32)
Calcium: >15 mg/dL (ref 8.9–10.3)
Creatinine, Ser: 1.79 mg/dL — ABNORMAL HIGH (ref 0.44–1.00)
GFR calc Af Amer: 35 mL/min — ABNORMAL LOW (ref >60)
GFR calc non Af Amer: 30 mL/min — ABNORMAL LOW (ref >60)
GLUCOSE: 374 mg/dL — AB (ref 70–99)
POTASSIUM: 3.9 mmol/L (ref 3.5–5.1)
Sodium: 151 mmol/L — ABNORMAL HIGH (ref 135–145)

## 2018-08-12 LAB — I-STAT TROPONIN, ED: Troponin i, poc: 0.83 ng/mL (ref 0.00–0.08)

## 2018-08-12 LAB — MAGNESIUM: MAGNESIUM: 3.7 mg/dL — AB (ref 1.7–2.4)

## 2018-08-12 MED ORDER — SODIUM BICARBONATE 8.4 % IV SOLN
INTRAVENOUS | Status: AC | PRN
  Administered 2018-08-12: 50 meq via INTRAVENOUS

## 2018-08-12 MED ORDER — NOREPINEPHRINE 4 MG/250ML-% IV SOLN
INTRAVENOUS | Status: AC
  Administered 2018-08-12: 10 ug/min via INTRAVENOUS
  Filled 2018-08-12: qty 250

## 2018-08-12 MED ORDER — EPINEPHRINE PF 1 MG/10ML IJ SOSY
PREFILLED_SYRINGE | INTRAMUSCULAR | Status: AC | PRN
  Administered 2018-08-12: 1 mg via INTRAVENOUS
  Administered 2018-08-12: 0.5 mg via INTRAVENOUS
  Administered 2018-08-12: 1 mg via INTRAVENOUS

## 2018-08-12 MED ORDER — CALCIUM CHLORIDE 10 % IV SOLN
INTRAVENOUS | Status: AC | PRN
  Administered 2018-08-12: 1 g via INTRAVENOUS

## 2018-08-12 MED ORDER — VANCOMYCIN HCL 10 G IV SOLR
1750.0000 mg | Freq: Once | INTRAVENOUS | Status: DC
  Filled 2018-08-12: qty 1750

## 2018-08-12 MED ORDER — INSULIN ASPART 100 UNIT/ML ~~LOC~~ SOLN: 0.0000 [IU] | SUBCUTANEOUS | Status: DC

## 2018-08-12 MED ORDER — EPINEPHRINE PF 1 MG/ML IJ SOLN
0.5000 ug/min | INTRAVENOUS | Status: DC
  Filled 2018-08-12: qty 4

## 2018-08-12 MED ORDER — DOCUSATE SODIUM 50 MG/5ML PO LIQD
100.0000 mg | Freq: Two times a day (BID) | ORAL | Status: DC | PRN
  Filled 2018-08-12: qty 10

## 2018-08-12 MED ORDER — SODIUM BICARBONATE 8.4 % IV SOLN
INTRAVENOUS | Status: AC | PRN
  Administered 2018-08-12 (×3): 50 meq via INTRAVENOUS

## 2018-08-12 MED ORDER — SODIUM CHLORIDE 0.9 % IV SOLN: 250.0000 mL | INTRAVENOUS | Status: DC | PRN

## 2018-08-12 MED ORDER — FAMOTIDINE IN NACL 20-0.9 MG/50ML-% IV SOLN: 20.0000 mg | INTRAVENOUS | Status: DC

## 2018-08-12 MED ORDER — NOREPINEPHRINE 4 MG/250ML-% IV SOLN
0.0000 ug/min | INTRAVENOUS | Status: DC
  Administered 2018-08-12: 10 ug/min via INTRAVENOUS

## 2018-08-12 MED ORDER — PIPERACILLIN-TAZOBACTAM 3.375 G IVPB 30 MIN: 3.3750 g | Freq: Once | INTRAVENOUS | Status: DC

## 2018-08-12 MED ORDER — VANCOMYCIN HCL IN DEXTROSE 1-5 GM/200ML-% IV SOLN: 1000.0000 mg | INTRAVENOUS | Status: DC

## 2018-08-12 MED ORDER — PIPERACILLIN-TAZOBACTAM 3.375 G IVPB: 3.3750 g | Freq: Three times a day (TID) | INTRAVENOUS | Status: DC

## 2018-08-12 MED ORDER — ROCURONIUM BROMIDE 50 MG/5ML IV SOLN
INTRAVENOUS | Status: AC | PRN
  Administered 2018-08-12: 80 mg via INTRAVENOUS

## 2018-08-12 MED ORDER — FENTANYL CITRATE (PF) 100 MCG/2ML IJ SOLN: 100.0000 ug | INTRAMUSCULAR | Status: DC | PRN

## 2018-08-12 MED ORDER — SODIUM BICARBONATE 8.4 % IV SOLN
INTRAVENOUS | Status: DC
  Administered 2018-08-12: 13:00:00 via INTRAVENOUS
  Filled 2018-08-12 (×3): qty 150

## 2018-08-12 MED ORDER — EPINEPHRINE PF 1 MG/ML IJ SOLN
0.5000 ug/min | INTRAVENOUS | Status: DC
  Administered 2018-08-12: 75 ug/min via INTRAVENOUS
  Filled 2018-08-12: qty 4

## 2018-08-12 MED ORDER — SODIUM CHLORIDE 0.9 % IV SOLN
INTRAVENOUS | Status: AC | PRN
  Administered 2018-08-12: 1000 mL via INTRAVENOUS

## 2018-08-12 MED FILL — Medication: Qty: 1 | Status: AC

## 2018-08-19 NOTE — ED Provider Notes (Signed)
[facility] EMERGENCY DEPARTMENT Provider Note   CSN: [id number] Arrival date & time: 08-18-2018  1200     History   Chief Complaint Chief Complaint  Patient presents with  . Cardiac Arrest    HPI Regina Stone is a 62 y.o. female.  HPI   Patient is a 62 year old female with PMHx of HTN, DM, and ESRD (not on HD) who presents from [facility] in cardiac arrest.  She was found unresponsive in asystole upon EMS arrival.  CPR initiated at 1050 and she received ROSC multiple times en route however would become pulseless again.  She received epi x9, Mg x2 g, Defib x3, and amnio 300mg  by EMS.  On arrival patient with mechanical CPR via LUCAS Device in progress.  Of note she was recently admitted to [facility] for abscess.  History limited given patient's acute critical medical condition.   Past Medical History:  Diagnosis Date  . Anxiety   . Chronic pain    previous back surgery, foot pain  . CKD (chronic kidney disease)   . Diabetes mellitus without complication (HCC)   . GERD (gastroesophageal reflux disease)   . GERD (gastroesophageal reflux disease)   . Hyperlipidemia   . Hypertension   . Osteoarthritis     Patient Active Problem List   Diagnosis Date Noted  . Cardiac arrest (HCC) 18-Aug-2018  . Onychomycosis 06/14/2015  . Pain in lower limb 06/14/2015    Past Surgical History:  Procedure Laterality Date  . SPINE SURGERY    . TUBAL LIGATION       OB History   None      Home Medications    Prior to Admission medications   Medication Sig Start Date End Date Taking? Authorizing Provider  aspirin 81 MG tablet Take 81 mg by mouth daily.    [provider]  ibuprofen (ADVIL,MOTRIN) 800 MG tablet TAKE 1 TABLET BY MOUTH TWICE DAILY AS NEEDED. 06/17/17   Eldred Manges, MD  lisinopril-hydrochlorothiazide (PRINZIDE,ZESTORETIC) 20-25 MG per tablet Take 1 tablet by mouth daily. Reported on 01/19/2016    [provider]    metFORMIN (GLUCOPHAGE) 1000 MG tablet Take 1,000 mg by mouth 2 (two) times daily with a meal.    [provider]  omeprazole (PRILOSEC) 40 MG capsule Take 40 mg by mouth daily.    [provider]  ondansetron (ZOFRAN) 4 MG tablet Take 4 mg by mouth every 8 (eight) hours as needed for nausea or vomiting.    [provider]  pregabalin (LYRICA) 50 MG capsule Take 50 mg by mouth 2 (two) times daily.    [provider]  simvastatin (ZOCOR) 80 MG tablet Take 80 mg by mouth at bedtime.    [provider]  sitaGLIPtin (JANUVIA) 100 MG tablet Take 100 mg by mouth daily.    [provider]  traMADol (ULTRAM) 50 MG tablet Take 50 mg by mouth every 6 (six) hours as needed.    [provider]    Family History No family history on file.  Social History Social History   Tobacco Use  . Smoking status: Never Smoker  . Smokeless tobacco: Never Used  Substance Use Topics  . Alcohol use: Not on file  . Drug use: Not on file     Allergies   Avandamet [rosiglitazone-metformin] and Avapro [irbesartan]   Review of Systems Review of Systems  Unable to perform ROS: Acuity of condition     Physical Exam Updated Vital  Signs BP (!) 61/51   Pulse (!) 47   Resp (!) 5   Ht 5\' 6"  (1.676 m)   Wt 90.8 kg   SpO2 (!) 21%   BMI 32.31 kg/m   Physical Exam  Constitutional: She appears toxic. She appears ill.  Unresponsive  HENT:  Mouth/Throat: Oropharynx is clear and moist.  Eyes:  R pupil 2mm, L pupil 1mm nonreactive  Neck: Neck supple.  Cardiovascular: Normal rate, regular rhythm and intact distal pulses.  Pulmonary/Chest:  King airway in place.  Frothy pink secretions.  Bilateral breath sounds present, coarse rhonchi throughout  Abdominal: Soft. She exhibits distension. There is no tenderness.  Neurological: She is unresponsive. GCS eye subscore is 1. GCS verbal subscore is 1. GCS motor subscore is 1.  No response to painful  stimuli  Skin: No rash noted.     ED Treatments / Results  Labs (all labs ordered are listed, but only abnormal results are displayed) Labs Reviewed  BASIC METABOLIC PANEL - Abnormal; Notable for the following components:      Result Value   Sodium 151 (*)    Glucose, Bld 374 (*)    BUN 23 (*)    Creatinine, Ser 1.79 (*)    Calcium >15.0 (*)    GFR calc non Af Amer 30 (*)    GFR calc Af Amer 35 (*)    All other components within normal limits  CBC - Abnormal; Notable for the following components:   WBC 18.3 (*)    HCT 48.7 (*)    MCHC 29.4 (*)    Platelets 52 (*)    All other components within normal limits  MAGNESIUM - Abnormal; Notable for the following components:   Magnesium 3.7 (*)    All other components within normal limits  I-STAT TROPONIN, ED - Abnormal; Notable for the following components:   Troponin i, poc 0.83 (*)    All other components within normal limits  CULTURE, BLOOD (ROUTINE X 2)  CULTURE, BLOOD (ROUTINE X 2)  URINE CULTURE  CULTURE, RESPIRATORY  HIV ANTIBODY (ROUTINE TESTING W REFLEX)  COMPREHENSIVE METABOLIC PANEL  MAGNESIUM  PHOSPHORUS  PROCALCITONIN  CORTISOL  CBC  PROTIME-INR  APTT  STREP PNEUMONIAE URINARY ANTIGEN  LACTIC ACID, PLASMA  LACTIC ACID, PLASMA  CBC  BASIC METABOLIC PANEL  BLOOD GAS, ARTERIAL  I-STAT CHEM 8, ED  I-STAT BETA HCG BLOOD, ED ([facility], [facility], [facility] ONLY)    EKG None  Radiology Dg Chest Portable 1 View  Result Date: 08-31-18 CLINICAL DATA:  Cardiac arrest. EXAM: PORTABLE CHEST 1 VIEW COMPARISON:  None. FINDINGS: Endotracheal tube is at the carina directed toward the right mainstem bronchus and needs to be retracted approximately 2 cm. NG tube is in the stomach. Extensive bilateral pulmonary edema, right greater than left. No effusions. No pneumothorax. Multiple displaced left lateral rib fractures. IMPRESSION: Endotracheal tube at the level of the right mainstem bronchus, needing to be retracted. Extensive bilateral  pulmonary edema, right greater than left. Multiple displaced left rib fractures. Critical Value/emergent results were called by telephone at the time of interpretation on 31-Aug-2018 at 12:28 pm to one of the ER physicians , who verbally acknowledged these results. Electronically Signed   By: Francene Boyers M.D.   On: 08-31-18 12:30    Procedures ARTERIAL LINE Date/Time: 2018/08/31 2:11 PM Performed by: Abelardo Diesel, MD Authorized by: Abelardo Diesel, MD   Consent:    Consent obtained:  Emergent situation Indications:    Indications: hemodynamic monitoring  and multiple ABGs   Pre-procedure details:    Skin preparation:  2% Chlorhexidine   Preparation: Patient was prepped and draped in sterile fashion   Anesthesia (see MAR for exact dosages):    Anesthesia method:  None Procedure details:    Location:  R radial   Needle gauge:  20 G   Placement technique:  Ultrasound guided   Number of attempts:  1 Post-procedure details:    Post-procedure:  Biopatch applied and secured with tape   CMS:  Normal   Patient tolerance of procedure:  Tolerated well, no immediate complications  .Central Line Date/Time: 09/05/18 6:24 PM Performed by: Abelardo Dieselice, Leeloo Silverthorne, MD Authorized by: Abelardo Dieselice, Maliki Gignac, MD   Consent:    Consent obtained:  Emergent situation Pre-procedure details:    Hand hygiene: Hand hygiene performed prior to insertion     Sterile barrier technique: All elements of maximal sterile technique followed     Skin preparation:  2% chlorhexidine   Skin preparation agent: Skin preparation agent completely dried prior to procedure   Procedure details:    Location:  R femoral   Site selection rationale:  Left IJ in place   Procedural supplies:  Triple lumen   Landmarks identified: yes     Ultrasound guidance: yes     Sterile ultrasound techniques: Sterile gel and sterile probe covers were used     Number of attempts:  3   Successful placement: yes   Post-procedure details:    Post-procedure:   Dressing applied and line sutured   Assessment:  Blood return through all ports and free fluid flow   Patient tolerance of procedure:  Tolerated well, no immediate complications Procedure Name: Intubation Date/Time: 09/05/18 6:24 PM Performed by: Abelardo Dieselice, Jerris Fleer, MD Oxygen Delivery Method: Ambu bag Induction Type: Rapid sequence Ventilation: Mask ventilation without difficulty Laryngoscope Size: Glidescope and 3 Grade View: Grade I Tube size: 7.0 mm Number of attempts: 1 Airway Equipment and Method: Rigid stylet Placement Confirmation: ETT inserted through vocal cords under direct vision,  Positive ETCO2,  CO2 detector and Breath sounds checked- equal and bilateral Tube secured with: ETT holder      (including critical care time)  Medications Ordered in ED Medications  sodium bicarbonate 150 mEq in dextrose 5 % 1,000 mL infusion ( Intravenous Stopped Oct 16, 2018 1430)  0.9 %  sodium chloride infusion (has no administration in time range)  famotidine (PEPCID) IVPB 20 mg premix (has no administration in time range)  EPINEPHrine (ADRENALIN) 4 mg in dextrose 5 % 250 mL (0.016 mg/mL) infusion (0 mcg/min Intravenous Hold Oct 16, 2018 1437)  norepinephrine (LEVOPHED) 4mg  in D5W 250mL premix infusion (0 mcg/min Intravenous Stopped Oct 16, 2018 1419)  EPINEPHrine (ADRENALIN) 4 mg in dextrose 5 % 250 mL (0.016 mg/mL) infusion (0 mcg/min Intravenous Stopped Oct 16, 2018 1430)  insulin aspart (novoLOG) injection 0-9 Units (has no administration in time range)  fentaNYL (SUBLIMAZE) injection 100 mcg (has no administration in time range)  docusate (COLACE) 50 MG/5ML liquid 100 mg (has no administration in time range)  vancomycin (VANCOCIN) 1,750 mg in sodium chloride 0.9 % 500 mL IVPB (has no administration in time range)  piperacillin-tazobactam (ZOSYN) IVPB 3.375 g (has no administration in time range)  vancomycin (VANCOCIN) IVPB 1000 mg/200 mL premix (has no administration in time range)  piperacillin-tazobactam  (ZOSYN) IVPB 3.375 g (has no administration in time range)  sodium bicarbonate injection (50 mEq Intravenous Given Oct 16, 2018 1212)  calcium chloride injection (1 g Intravenous Given Oct 16, 2018 1155)  EPINEPHrine (ADRENALIN) 1 MG/10ML injection (  0.5 mg Intravenous Given 08/13/18 1200)  0.9 %  sodium chloride infusion ( Intravenous Stopped 08-13-18 1430)  rocuronium (ZEMURON) injection (80 mg Intravenous Given 08/13/2018 1204)  sodium bicarbonate injection (50 mEq Intravenous Given 08-13-2018 1221)     Initial Impression / Assessment and Plan / ED Course  I have reviewed the triage vital signs and the nursing notes.  Pertinent labs & imaging results that were available during my care of the patient were reviewed by me and considered in my medical decision making (see chart for details).    Patient is a 62 year old female with PMHx of HTN, DM, and ESRD (not on HD) who presents from [facility] in cardiac arrest.  On arrival mechanical CPR is in progress.  She was given pushes of epi, bicarb, calcium, and magnesium.  A King airway is in place with significant frothy pink secretions noted.   On initial ED pulse check, strong femoral pulses present.  She was subsequently intubated and started on an epinephrine and bicarb gtt.  These medications were subsequently escalated to maintain cardiovascular support requiring the addition of Levophed at maximum dosees. Central venous access and radial arterial line access was obtained as procedure notes above.  Patient with prolonged downtime and likely poor clinical outcome.  Etiology unclear at this time as significant acidosis present.  CXR with R>L pulmonary infiltrate likely aspiration vs pulmonary edema.  Critical care at bedside who discussed patient's critical status with family.  They ultimately decided to make patient DNR and withdraw care.  She was compassionately extubated in the ED.  Time of death 37.  Final Clinical Impressions(s) / ED Diagnoses    Final diagnoses:  Cardiac arrest [facility])    ED Discharge Orders    None       Abelardo Diesel, MD Aug 13, 2018 Herbie [place]    Gerhard Munch, MD 08/14/18 2357

## 2018-08-19 NOTE — Code Documentation (Signed)
Epi drip decreased to 50

## 2018-08-19 NOTE — H&P (Signed)
NAME:  Regina ReedDiana Stone, MRN:  [id number], DOB:  01/25/1956, LOS: 0 ADMISSION DATE:  01-Apr-2018, CONSULTATION DATE:  12-10-2017 REFERRING MD:  Dr. Jeraldine LootsLockwood, CHIEF COMPLAINT:  Cardiac arrest    Brief History   62 y/o F who presented to [facility] on 9/24 after suffering a cardiac arrest.    She has a history of HTN, DM, anxiety & chronic low back pain.  The patient was [facility] at the end of May 2019 for a T6-T10 Laminectomy.  Post procedure the patient was discharged to re[facility].  There was some report that the patient was recently seen for an abscess but family does not have other information / none consistent with such in Epic review.    She was found the am of presentation altered without a pulse. She was asystolic on EMS arrival.  CPR initiated at 1050.  During transport, EMS apparently achieved intermittently but would lose pulses.  She was CPR in progress on arrival to ER.  Received 9 epi, 2gm Mg, 3 shock and 300 amiodarone in route.  The patient was treated with EPI gtt.  Central venous access established in ER.  Per ER report, patient required > 1 hour CPR.  CXR consistent with R>L airspace disease (edema vs aspiration).  Troponin 0.83, pH 7.0, pCO2 > 90.  Further labs pending at time of admit.    Significant Hospital Events   9/24  Admit post cardiac arrest.   Consults: date of consult/date signed off & final recs:    Procedures (surgical and bedside):  R Femoral TLC (ER) 9/24 >>   Significant Diagnostic Tests:    Micro Data: HIV 9/24 >>  U. Strep 9/24 >>  BCx2 9/24 >>  UC 9/24 >>  Tracheal aspirate 9/24 >>   Antimicrobials:  Vanco 9/24 >>  Zosyn 9/24 >>    Subjective:  RN reports epi gtt at 120 mcg's, levo at 30 mcg  Objective   Blood pressure 107/83, resp. rate 18, height 5\' 6"  (1.676 m), weight 90.7 kg.       No intake or output data in the 24 hours ending 001-22-2019 1303 Filed Weights   001-22-2019 1210  Weight: 90.7 kg     Examination: General: critically ill appearing female lying on ER stretcher  HENT: ETT, bloody oral secretions, pupils 4mm fixed Lungs: non-labored on vent, not breathing over set rate, coarse breath sounds  Cardiovascular: s1s2 distant Abdomen: soft/bsx4 hypoactive  Extremities: cool / dry, trace BLE edema  Neuro: obtunded, no response to pain, verbal stimuli  Resolved Hospital Problem list     Assessment & Plan:   Cardiac Arrest with associated Shock- asystole initial rhythm, >1 hour CPR before ROSC P: Admit to ICU Not a candidate for cooling due to prolonged down time Normothermia goal, avoid fever  Trend troponin Would support DNR in the event of further arrest.  Await family arrival.  EPI, levophed gtt's for MAP> 65   R>L Pulmonary Infiltrate - suspect aspiration vs pulmonary edema post arrest  P: Pan culture  Empiric vancomycin / zosyn as above  Follow CXR    Acute Hypoxic Respiratory Failure  P: PRVC 8 cc/kg  Increase rate to 30 to compensate for acidosis Wean PEEP / FiO2 for sats > 90% Follow up ABG     Anticipate AKI on CKD - post arrest  Acute Metabolic Acidosis  P: Trend BMP / urinary output Replace electrolytes as indicated Avoid nephrotoxic agents, ensure adequate renal perfusion  DM  P: SSI    Disposition / Summary of Today's Plan 09/08/2018   Anticipate death in ER.  Family in agreement for DNR / no further escalation of care.     Diet: NPO Pain/Anxiety/Delirium protocol (if indicated): PRN fentanyl only  VAP protocol (if indicated): in place  DVT prophylaxis: SCD's GI prophylaxis: pepcid  Hyperglycemia protocol: SSI Mobility:bed rest  Code Status: DNR  Family Communication: Family updated at bedside 2023-09-09 (sister, aunt x2 + others) who are in agreement that no further escalation of care would be in her best interest.  They report she was "having a good day yesterday", was excited that she got her nails painted.  Family support  offered.  Chaplain at bedside.   Labs   CBC: No results for input(s): WBC, NEUTROABS, HGB, HCT, MCV, PLT in the last 168 hours. Basic Metabolic Panel: No results for input(s): NA, K, CL, CO2, GLUCOSE, BUN, CREATININE, CALCIUM, MG, PHOS in the last 168 hours. GFR: CrCl cannot be calculated (No successful lab value found.). No results for input(s): PROCALCITON, WBC, LATICACIDVEN in the last 168 hours. Liver Function Tests: No results for input(s): AST, ALT, ALKPHOS, BILITOT, PROT, ALBUMIN in the last 168 hours. No results for input(s): LIPASE, AMYLASE in the last 168 hours. No results for input(s): AMMONIA in the last 168 hours. ABG No results found for: PHART, PCO2ART, PO2ART, HCO3, TCO2, ACIDBASEDEF, O2SAT  Coagulation Profile: No results for input(s): INR, PROTIME in the last 168 hours. Cardiac Enzymes: No results for input(s): CKTOTAL, CKMB, CKMBINDEX, TROPONINI in the last 168 hours. HbA1C: No results found for: HGBA1C CBG: No results for input(s): GLUCAP in the last 168 hours.  Admitting History of Present Illness.   As above   Review of Systems:   Unable to complete as patient is altered on mechanical ventilation.   Past medical history  She,  has a past medical history of Chronic pain, Diabetes mellitus without complication (HCC), GERD (gastroesophageal reflux disease), Hyperlipidemia, and Hypertension.   Surgical History    Past Surgical History:  Procedure Laterality Date  . SPINE SURGERY    . TUBAL LIGATION       Social History   Social History   Socioeconomic History  . Marital status: Married    Spouse name: Not on file  . Number of children: Not on file  . Years of education: Not on file  . Highest education level: Not on file  Occupational History  . Not on file  Social Needs  . Financial resource strain: Not on file  . Food insecurity:    Worry: Not on file    Inability: Not on file  . Transportation needs:    Medical: Not on file     Non-medical: Not on file  Tobacco Use  . Smoking status: Never Smoker  . Smokeless tobacco: Never Used  Substance and Sexual Activity  . Alcohol use: Not on file  . Drug use: Not on file  . Sexual activity: Not on file  Lifestyle  . Physical activity:    Days per week: Not on file    Minutes per session: Not on file  . Stress: Not on file  Relationships  . Social connections:    Talks on phone: Not on file    Gets together: Not on file    Attends religious service: Not on file    Active member of club or organization: Not on file    Attends meetings of clubs or organizations: Not  on file    Relationship status: Not on file  . Intimate partner violence:    Fear of current or ex partner: Not on file    Emotionally abused: Not on file    Physically abused: Not on file    Forced sexual activity: Not on file  Other Topics Concern  . Not on file  Social History Narrative  . Not on file  ,  reports that she has never smoked. She has never used smokeless tobacco.   Family history   Her family history is not on file.   Allergies Allergies  Allergen Reactions  . Avandamet [Rosiglitazone-Metformin]   . Avapro [Irbesartan]     Home meds  Prior to Admission medications   Medication Sig Start Date End Date Taking? Authorizing Provider  aspirin 81 MG tablet Take 81 mg by mouth daily.    [provider]  ibuprofen (ADVIL,MOTRIN) 800 MG tablet TAKE 1 TABLET BY MOUTH TWICE DAILY AS NEEDED. 06/17/17   Eldred Manges, MD  lisinopril-hydrochlorothiazide (PRINZIDE,ZESTORETIC) 20-25 MG per tablet Take 1 tablet by mouth daily. Reported on 01/19/2016    [provider]  metFORMIN (GLUCOPHAGE) 1000 MG tablet Take 1,000 mg by mouth 2 (two) times daily with a meal.    [provider]  omeprazole (PRILOSEC) 40 MG capsule Take 40 mg by mouth daily.    [provider]  ondansetron (ZOFRAN) 4 MG tablet Take 4 mg by mouth every 8 (eight) hours as needed for nausea or  vomiting.    [provider]  pregabalin (LYRICA) 50 MG capsule Take 50 mg by mouth 2 (two) times daily.    [provider]  simvastatin (ZOCOR) 80 MG tablet Take 80 mg by mouth at bedtime.    [provider]  sitaGLIPtin (JANUVIA) 100 MG tablet Take 100 mg by mouth daily.    [provider]  traMADol (ULTRAM) 50 MG tablet Take 50 mg by mouth every 6 (six) hours as needed.    [provider]       Canary Brim, NP-C [facility] Pgr: [phone] or if no answer [phone] 09/03/18, 1:03 PM

## 2018-08-19 NOTE — ED Notes (Signed)
At bedside, pt is in asystole on monitor, no palpable central pulses. No cardiac sounds on auscultation. MD Rice made aware, to bedside. TOD 1426

## 2018-08-19 NOTE — Code Documentation (Signed)
Pulses present  

## 2018-08-19 NOTE — ED Triage Notes (Signed)
Pt from [facility] after going unresponsive, asystole upon ems arrival. Initial CPR started at 1050 and achieved rosc multiple times in rout for very short time then pt would go pulseless again. CPR in progress on arrival here. Pt received 9 ep;is, 2g of mag, 3 shocks, 300 amio in route. Pt went to [facility] recently for abscess.

## 2018-08-19 NOTE — Code Documentation (Signed)
Epi drip started at 75
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
## 2018-08-19 NOTE — Progress Notes (Signed)
Pharmacy Antibiotic Note  [facility]ina Reed[facility]iana Stone is a 62 y.o. female admitted on 25-Aug-2018 from SNF with CPR in progress. She achieved ROSC after prolonged CPR. Pharmacy has been consulted to begin antibiotics for possible pneumonia. SCr 1.79, eCrCl ~ 35 ml/min. Patient currently intubated and on vasopressors.    Plan: -Vancomycin 1750 mg IV x1 then 1 g IV q24h -Zosyn 3.375 g IV q8h -Monitor renal fx, cultures, would obtain vancomycin trough at steady state    Height: 5\' 6"  (167.6 cm) Weight: 200 lb (90.7 kg) IBW/kg (Calculated) : 59.3  No data recorded.  Recent Labs  Lab 03/02/18 1215  WBC 18.3*  CREATININE 1.79*      Antimicrobials this admission: 9/24 zosyn > 9/24 vancomycin >  Dose adjustments this admission: N/A  Microbiology results: 9/24 resp cx: 9/24 urine cx: 9/24 blood cx:   Baldemar FridayMasters, Judithann Villamar M 25-Aug-2018 2:07 PM

## 2018-08-19 NOTE — Code Documentation (Signed)
Strong left femoral pulse present

## 2018-08-19 DEATH — deceased

## 2019-03-13 IMAGING — DX DG CHEST 1V PORT
1 series · 1 of 1 positions shown · non-contrast
Comparison: None.

CLINICAL DATA: Cardiac arrest.

EXAM:
PORTABLE CHEST 1 VIEW

[chest ap]
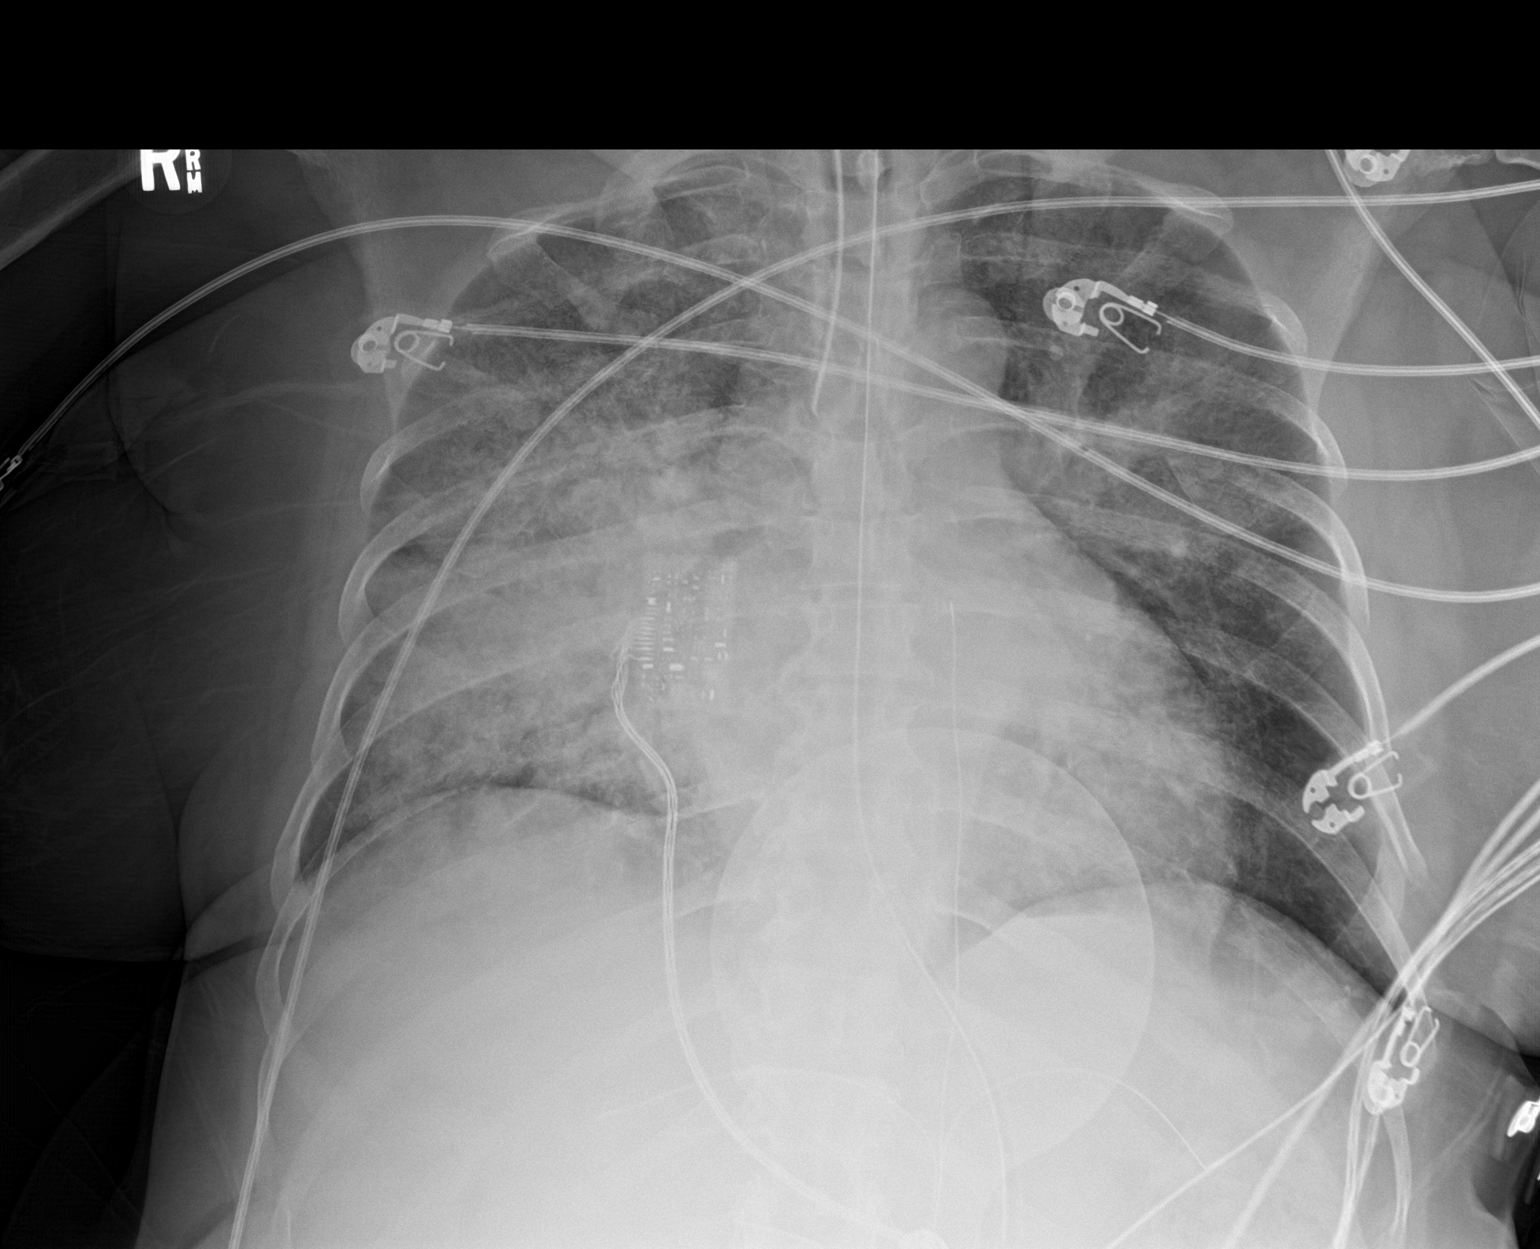

[1 of 1 positions shown; findings below may reference images not displayed]

FINDINGS: Endotracheal tube is at the carina directed toward the right
mainstem bronchus and needs to be retracted approximately 2 cm. NG
tube is in the stomach.

Extensive bilateral pulmonary edema, right greater than left. No
effusions. No pneumothorax.

Multiple displaced left lateral rib fractures.
IMPRESSION: Endotracheal tube at the level of the right mainstem bronchus,
needing to be retracted.

Extensive bilateral pulmonary edema, right greater than left.

Multiple displaced left rib fractures. Critical Value/emergent
results were called by telephone at the time of interpretation on
08/12/2018 at [DATE] to one of the ER physicians , who verbally
acknowledged these results.
